# Patient Record
Sex: Female | Born: 2000 | Race: White | Hispanic: No | Marital: Single | Smoking: Never smoker
Health system: Southern US, Community
[De-identification: ages and names within clinical notes are randomized; demographics above are authoritative.]

## PROBLEM LIST (undated history)

## (undated) DIAGNOSIS — F909 Attention-deficit hyperactivity disorder, unspecified type: Secondary | ICD-10-CM

## (undated) HISTORY — PX: WISDOM TOOTH EXTRACTION: SHX21

---
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
## 2022-11-10 ENCOUNTER — Ambulatory Visit
Admission: RE | Admit: 2022-11-10 | Discharge: 2022-11-10 | Disposition: A | Discharge status: Home / Self Care | Payer: Managed Care, Other (non HMO) | Source: Ambulatory Visit

## 2022-11-10 ENCOUNTER — Ambulatory Visit: Payer: Managed Care, Other (non HMO) | Admitting: Medical

## 2022-11-10 VITALS — BP 97/60 | HR 94 | Temp 98.7°F | Resp 18

## 2022-11-10 DIAGNOSIS — J069 Acute upper respiratory infection, unspecified: Secondary | ICD-10-CM | POA: Diagnosis not present

## 2022-11-10 MED ORDER — AZELASTINE HCL 0.1 % NA SOLN: 1.0000 | Freq: Two times a day (BID) | NASAL | 0 refills | Status: DC

## 2022-11-10 NOTE — ED Triage Notes (Signed)
Patient to Urgent Care with complaints of right sided eye swelling/ watery eyes and nasal drainage. Reports a lot of sneezing. Dry cough.  Reports symptoms started two days ago.  Has been taking dayquil/ nyquil/ allergy meds/ advil.

## 2022-11-10 NOTE — ED Provider Notes (Signed)
Patricia Kelly    CSN: [id number] Arrival date & time: 11/10/22  1451      History   Chief Complaint Chief Complaint  Patient presents with   Migraine    Water eyes runny nose - Entered by patient    HPI Patricia Kelly is a 22 y.o. female.    Migraine   Patient presents to urgent care with complaint of symptoms starting 2 days ago.  She endorses right eye swelling and watery discharge as well as runny nasal drainage and congestion.  Reports sneezing and occasional dry cough.  She has been using OTC medications such as Zyrtec, DayQuil, NyQuil and reports her nasal congestion and runny nose has not been adequately treated.   Past Medical History:  Diagnosis Date   ADHD    Kidney infection 07/20/2020    There are no problems to display for this patient.   No past surgical history on file.  OB History   No obstetric history on file.      Home Medications    Prior to Admission medications   Medication Sig Start Date End Date Taking? Authorizing Provider  amoxicillin-clavulanate (AUGMENTIN) 875-125 MG tablet Take 1 tablet by mouth every 12 (twelve) hours. 08/17/20   Sharion Balloon, NP  amphetamine-dextroamphetamine (ADDERALL XR) 30 MG 24 hr capsule Take 30 mg by mouth daily.    [provider]  amphetamine-dextroamphetamine (ADDERALL) 5 MG tablet Take by mouth daily.    [provider]  drospirenone-ethinyl estradiol (YAZ) 3-0.02 MG tablet Take 1 tablet by mouth daily.    [provider]  spironolactone (ALDACTONE) 25 MG tablet Take 25 mg by mouth daily.    [provider]  spironolactone (ALDACTONE) 50 MG tablet Take by mouth daily.    [provider]    Family History No family history on file.  Social History Social History   Tobacco Use   Smoking status: Never   Smokeless tobacco: Never  Substance Use Topics   Alcohol use: Yes   Drug use: Never     Allergies   Patient has no known  allergies.   Review of Systems Review of Systems   Physical Exam Triage Vital Signs ED Triage Vitals  Enc Vitals Group     BP      Pulse      Resp      Temp      Temp src      SpO2      Weight      Height      Head Circumference      Peak Flow      Pain Score      Pain Loc      Pain Edu?      Excl. in [place]?    No data found.  Updated Vital Signs There were no vitals taken for this visit.  Visual Acuity Right Eye Distance:   Left Eye Distance:   Bilateral Distance:    Right Eye Near:   Left Eye Near:    Bilateral Near:     Physical Exam Vitals reviewed.  Constitutional:      Appearance: Normal appearance.  Eyes:     General:        Right eye: Discharge present.  Cardiovascular:     Rate and Rhythm: Normal rate and regular rhythm.     Pulses: Normal pulses.     Heart sounds: Normal heart sounds.  Pulmonary:     Effort: Pulmonary  effort is normal.     Breath sounds: Normal breath sounds.  Skin:    General: Skin is warm and dry.  Neurological:     General: No focal deficit present.     Mental Status: She is alert and oriented to person, place, and time.  Psychiatric:        Mood and Affect: Mood normal.        Behavior: Behavior normal.      UC Treatments / Results  Labs (all labs ordered are listed, but only abnormal results are displayed) Labs Reviewed - No data to display  EKG   Radiology No results found.  Procedures Procedures (including critical care time)  Medications Ordered in UC Medications - No data to display  Initial Impression / Assessment and Plan / UC Course  I have reviewed the triage vital signs and the nursing notes.  Pertinent labs & imaging results that were available during my care of the patient were reviewed by me and considered in my medical decision making (see chart for details).   Patient is afebrile here without recent antipyretics. Satting well on room air. Overall is ill appearing, well hydrated, without  respiratory distress. Pulmonary exam is unremarkable.  Lungs CTAB without wheezing, rhonchi, rales.  Right eye conjunctival injection with significant watery discharge is present.  Symptoms are consistent with an acute viral process.  Recommending continued use of OTC medication for symptom control and will provide patient with recommendation list.  Will also prescribe Astelin spray to help resolve rhinorrhea.  Final Clinical Impressions(s) / UC Diagnoses   Final diagnoses:  None   Discharge Instructions   None    ED Prescriptions   None    PDMP not reviewed this encounter.   Rose Phi, [place] 11/10/22 1550

## 2022-11-10 NOTE — Discharge Instructions (Addendum)
You have been diagnosed with a viral upper respiratory infection based on your symptoms and exam. Viral illnesses cannot be treated with antibiotics - they are self limiting - and you should find your symptoms resolving within a few days. Get plenty of rest and non-caffeinated fluids. Watch for signs of dehydration including reduced urine output and dark colored urine.  We recommend you use over-the-counter medications for symptom control including acetaminophen (Tylenol), ibuprofen (Advil/Motrin) or naproxen (Aleve) for throat pain, fever, chills or body aches. You may combine use of acetaminophen and ibuprofen/naproxen if needed.  Some patients find an pain-relieving throat spray such as Chloraseptic to be effective.     Also recommend cold/cough medication containing a cough suppressant such as dextromethorphan, as needed.  Saline mist spray is helpful for removing excess mucus from your nose.  Room humidifiers are helpful to ease breathing at night. I recommend guaifenesin (Mucinex) with plenty of water throughout the day to help thin and loosen mucus secretions in your respiratory passages.   If appropriate based upon your other medical problems, you might also find relief of nasal/sinus congestion symptoms by using a nasal decongestant such as fluticasone (Flonase ) or pseudoephedrine (Sudafed sinus).  You will need to obtain Sudafed from behind the pharmacist counter.  Speak to the pharmacist to verify that you are not duplicating medications with other over-the-counter formulations that you may be using.   

## 2023-02-07 ENCOUNTER — Ambulatory Visit (INDEPENDENT_AMBULATORY_CARE_PROVIDER_SITE_OTHER): Payer: Managed Care, Other (non HMO) | Admitting: Adult Health

## 2023-02-07 ENCOUNTER — Encounter: Payer: Self-pay | Admitting: Adult Health

## 2023-02-07 VITALS — HR 134 | Temp 98.7°F | Ht 63.0 in | Wt 132.0 lb

## 2023-02-07 DIAGNOSIS — R300 Dysuria: Secondary | ICD-10-CM

## 2023-02-07 DIAGNOSIS — R319 Hematuria, unspecified: Secondary | ICD-10-CM

## 2023-02-07 DIAGNOSIS — N390 Urinary tract infection, site not specified: Secondary | ICD-10-CM | POA: Diagnosis not present

## 2023-02-07 LAB — POCT URINALYSIS DIPSTICK (MANUAL)
Nitrite, UA: NEGATIVE
Poct Bilirubin: NEGATIVE
Poct Blood: 250 — AB
Poct Glucose: NORMAL mg/dL
Poct Ketones: NEGATIVE
Poct Protein: NEGATIVE mg/dL
Poct Urobilinogen: NORMAL mg/dL
Spec Grav, UA: 1.015 (ref 1.010–1.025)
pH, UA: 6.5 (ref 5.0–8.0)

## 2023-02-07 MED ORDER — NITROFURANTOIN MONOHYD MACRO 100 MG PO CAPS: 100.0000 mg | ORAL_CAPSULE | Freq: Two times a day (BID) | ORAL | 0 refills | Status: DC

## 2023-02-07 NOTE — Progress Notes (Signed)
[facility] [address] [place], [place] [address] Phone: [phone] Fax: [phone]   Office Visit Note  Patient Name: Patricia Kelly  Date of ONGEX:[id number]  Med Rec number [id number]  Date of Service: 02/07/2023  Patient has no known allergies.  Chief Complaint  Patient presents with   Urinary Tract Infection     Urinary Tract Infection  Associated symptoms include frequency and urgency. Pertinent negatives include no chills, flank pain or hematuria.    Patient reports 2 days ago she started having some pain with urination.  She started drinking cranberry juice which seemed to help.  Then it work her up overnight, and she has taken tylenol and motrin, and that seems to have helped some. She denies any flank or abdominal pain.  She is not currently sexually active.   Current Medication:  Outpatient Encounter Medications as of 02/07/2023  Medication Sig   amphetamine-dextroamphetamine (ADDERALL XR) 30 MG 24 hr capsule Take 30 mg by mouth daily.   amphetamine-dextroamphetamine (ADDERALL) 5 MG tablet Take by mouth daily.   spironolactone (ALDACTONE) 25 MG tablet Take 25 mg by mouth daily.   spironolactone (ALDACTONE) 50 MG tablet Take by mouth daily.   amoxicillin-clavulanate (AUGMENTIN) 875-125 MG tablet Take 1 tablet by mouth every 12 (twelve) hours. (Patient not taking: Reported on 02/07/2023)   azelastine (ASTELIN) 0.1 % nasal spray Place 1 spray into both nostrils 2 (two) times daily. Use in each nostril as directed (Patient not taking: Reported on 02/07/2023)   drospirenone-ethinyl estradiol (YAZ) 3-0.02 MG tablet Take 1 tablet by mouth daily.   No facility-administered encounter medications on file as of 02/07/2023.      Medical History: Past Medical History:  Diagnosis Date   ADHD    Kidney infection 07/20/2020     Vital Signs: Pulse (!) 134   Temp 98.7 F (37.1 C) (Tympanic)   Ht 5\' 3"  (1.6 m)   Wt 132 lb (59.9 kg)   SpO2 98%   BMI 23.38 kg/m     Review of Systems  Constitutional:  Negative for chills, fatigue and fever.  Eyes:  Negative for pain, redness and itching.  Gastrointestinal:  Negative for abdominal pain.  Genitourinary:  Positive for frequency and urgency. Negative for difficulty urinating, flank pain and hematuria.    Physical Exam Vitals and nursing note reviewed.  Constitutional:      Appearance: Normal appearance.  HENT:     Head: Normocephalic.  Abdominal:     Tenderness: There is no abdominal tenderness. There is no right CVA tenderness or left CVA tenderness.  Neurological:     Mental Status: She is alert.      Results for orders placed or performed in visit on 02/07/23 (from the past 24 hour(s))  POCT Urinalysis Dip Manual     Status: Abnormal   Collection Time: 02/07/23  4:21 PM  Result Value Ref Range   Spec Grav, UA 1.015 1.010 - 1.025   pH, UA 6.5 5.0 - 8.0   Leukocytes, UA Trace (A) Negative   Nitrite, UA Negative Negative   Poct Protein Negative Negative, trace mg/dL   Poct Glucose Normal Normal mg/dL   Poct Ketones Negative Negative   Poct Urobilinogen Normal Normal mg/dL   Poct Bilirubin Negative Negative   Poct Blood =250 (A) Negative, trace    Assessment/Plan: 1. Urinary tract infection with hematuria, site unspecified Take Nitrofurantoin every 12 hours (twice a day) with food x 7d; Finish all antibiotics. Drink plenty of water.  Avoid or limit alcohol and caffeine, which may make symptoms worse. You may take an over-the-counter pain reliever (i.e., AZO urinary pain relief, Tylenol) as needed for pain next 1-2 days. Send secure message to provider or schedule return appointment as needed for new/worsening symptoms (such as fever or abdominal pain) if your symptoms are not improving after 2-3 days taking antibiotics or if your symptoms do not completely resolve following antibiotics.  - nitrofurantoin, macrocrystal-monohydrate, (MACROBID) 100 MG capsule; Take 1 capsule (100 mg total)  by mouth 2 (two) times daily.  Dispense: 14 capsule; Refill: 0  2. Dysuria - POCT Urinalysis Dip Manual             General Counseling: Leida verbalizes understanding of the findings of todays visit and agrees with plan of treatment. I have discussed any further diagnostic evaluation that may be needed or ordered today. We also reviewed her medications today. she has been encouraged to call the office with any questions or concerns that should arise related to todays visit.   Orders Placed This Encounter  Procedures   POCT Urinalysis Dip Manual    No orders of the defined types were placed in this encounter.   Time spent:15 Minutes Time spent includes review of chart, medications, test results, and follow up plan with the patient.    Johnna Acosta AGNP-C Nurse Practitioner

## 2023-06-13 ENCOUNTER — Ambulatory Visit (INDEPENDENT_AMBULATORY_CARE_PROVIDER_SITE_OTHER): Payer: Managed Care, Other (non HMO) | Admitting: Adult Health

## 2023-06-13 ENCOUNTER — Encounter: Payer: Self-pay | Admitting: Adult Health

## 2023-06-13 VITALS — BP 106/70 | HR 103 | Temp 99.6°F | Ht 62.5 in | Wt 136.0 lb

## 2023-06-13 DIAGNOSIS — U071 COVID-19: Secondary | ICD-10-CM

## 2023-06-13 DIAGNOSIS — J029 Acute pharyngitis, unspecified: Secondary | ICD-10-CM

## 2023-06-13 LAB — POC SOFIA 2 FLU + SARS ANTIGEN FIA
Influenza A, POC: NEGATIVE
Influenza B, POC: NEGATIVE
SARS Coronavirus 2 Ag: POSITIVE — AB

## 2023-06-13 NOTE — Progress Notes (Signed)
[facility] [address] [place], [place] [address] Phone: [phone] Fax: [phone]   Office Visit Note  Patient Name: Patricia Kelly  Date of ZHYQM:[id number]  Med Rec number [id number]  Date of Service: 06/13/2023  Patient has no known allergies.  Chief Complaint  Patient presents with   Acute Visit     HPI Patient reports started feeling bad yesterday morning.  She has a few close contacts who were briefly sick.  She describes sore throat, headache, ear pressure and body aches.  She took tylenol Pm last night, and slept well.    Current Medication:  Outpatient Encounter Medications as of 06/13/2023  Medication Sig   amphetamine-dextroamphetamine (ADDERALL XR) 30 MG 24 hr capsule Take 30 mg by mouth daily.   drospirenone-ethinyl estradiol (YAZ) 3-0.02 MG tablet Take 1 tablet by mouth daily.   amoxicillin-clavulanate (AUGMENTIN) 875-125 MG tablet Take 1 tablet by mouth every 12 (twelve) hours. (Patient not taking: Reported on 02/07/2023)   amphetamine-dextroamphetamine (ADDERALL) 5 MG tablet Take by mouth daily. (Patient not taking: Reported on 06/13/2023)   azelastine (ASTELIN) 0.1 % nasal spray Place 1 spray into both nostrils 2 (two) times daily. Use in each nostril as directed (Patient not taking: Reported on 02/07/2023)   nitrofurantoin, macrocrystal-monohydrate, (MACROBID) 100 MG capsule Take 1 capsule (100 mg total) by mouth 2 (two) times daily. (Patient not taking: Reported on 06/13/2023)   spironolactone (ALDACTONE) 25 MG tablet Take 25 mg by mouth daily. (Patient not taking: Reported on 06/13/2023)   spironolactone (ALDACTONE) 50 MG tablet Take by mouth daily. (Patient not taking: Reported on 06/13/2023)   No facility-administered encounter medications on file as of 06/13/2023.      Medical History: Past Medical History:  Diagnosis Date   ADHD    Kidney infection 07/20/2020     Vital Signs: BP 106/70   Pulse (!) 103   Temp 99.6 F (37.6 C)  (Tympanic)   Ht 5' 2.5" (1.588 m)   Wt 136 lb (61.7 kg)   SpO2 99%   BMI 24.48 kg/m    Review of Systems  Constitutional:  Positive for fatigue. Negative for chills and fever.  HENT:  Positive for ear pain and sore throat.   Eyes:  Negative for pain and itching.  Respiratory:  Negative for cough.   Cardiovascular:  Negative for chest pain.  Gastrointestinal:  Negative for diarrhea, nausea and vomiting.  Musculoskeletal:  Positive for myalgias.  Neurological:  Positive for headaches.    Physical Exam Vitals reviewed.  Constitutional:      Appearance: Normal appearance.  HENT:     Head: Normocephalic.     Right Ear: Tympanic membrane and ear canal normal.     Left Ear: Tympanic membrane and ear canal normal.     Nose: Nose normal.     Mouth/Throat:     Mouth: Mucous membranes are moist.  Eyes:     Pupils: Pupils are equal, round, and reactive to light.  Pulmonary:     Effort: Pulmonary effort is normal.     Breath sounds: Normal breath sounds.  Lymphadenopathy:     Cervical: No cervical adenopathy.  Neurological:     Mental Status: She is alert.      Results for orders placed or performed in visit on 06/13/23 (from the past 24 hour(s))  POC SOFIA 2 FLU + SARS ANTIGEN FIA     Status: Abnormal   Collection Time: 06/13/23  9:57 AM  Result Value Ref Range  Influenza A, POC Negative Negative   Influenza B, POC Negative Negative   SARS Coronavirus 2 Ag Positive (A) Negative    Assessment/Plan: 1. COVID-19 You should wear a well-fitting mask (ideally a N95 or KN95) anytime you must be outside your room for the initial 5 days of symptoms. After the initial 5 days, you may resume activities as you feel appropriate as long as you continue to wear a mask.  Rest, and drink plenty of water.  Use cough drops, gargle warm sal water or drink wam liquids (like tea with honey) as needed for cough/throat irritation.   Take over-the-counter medicines (such as Dayquil or Nyquil) as  discussed at your visit to help manage your symptoms.  Send a MyChart message to the provider or schedule a return appointment as needed for new/worsening symptoms (especially shortness of breath or chest pain) or if symptoms not improving with recommended treatment over the next 5-7 days.      2. Sore throat - POC SOFIA 2 FLU + SARS ANTIGEN FIA     General Counseling: Patricia Kelly verbalizes understanding of the findings of todays visit and agrees with plan of treatment. I have discussed any further diagnostic evaluation that may be needed or ordered today. We also reviewed her medications today. she has been encouraged to call the office with any questions or concerns that should arise related to todays visit.   Orders Placed This Encounter  Procedures   POC SOFIA 2 FLU + SARS ANTIGEN FIA    No orders of the defined types were placed in this encounter.   Time spent:15 Minutes Time spent includes review of chart, medications, test results, and follow up plan with the patient.    Patricia Kelly AGNP-C Nurse Practitioner

## 2023-06-14 ENCOUNTER — Encounter: Payer: Self-pay | Admitting: Adult Health

## 2023-07-06 ENCOUNTER — Encounter: Payer: Self-pay | Admitting: Physician Assistant

## 2023-07-06 ENCOUNTER — Ambulatory Visit: Payer: Managed Care, Other (non HMO) | Admitting: Physician Assistant

## 2023-07-06 VITALS — HR 81 | Temp 99.8°F

## 2023-07-06 DIAGNOSIS — R300 Dysuria: Secondary | ICD-10-CM | POA: Diagnosis not present

## 2023-07-06 DIAGNOSIS — N390 Urinary tract infection, site not specified: Secondary | ICD-10-CM | POA: Diagnosis not present

## 2023-07-06 DIAGNOSIS — R319 Hematuria, unspecified: Secondary | ICD-10-CM

## 2023-07-06 LAB — POCT URINALYSIS DIPSTICK (MANUAL)
Nitrite, UA: NEGATIVE
Poct Bilirubin: NEGATIVE
Poct Blood: 250 — AB
Poct Glucose: NORMAL mg/dL
Poct Ketones: NEGATIVE
Poct Protein: NEGATIVE mg/dL
Poct Urobilinogen: NORMAL mg/dL
Spec Grav, UA: <=1.005 — AB (ref 1.010–1.025)
pH, UA: 6 (ref 5.0–8.0)

## 2023-07-06 MED ORDER — CEPHALEXIN 500 MG PO CAPS: 500.0000 mg | ORAL_CAPSULE | Freq: Two times a day (BID) | ORAL | 0 refills | Status: AC

## 2023-07-06 NOTE — Progress Notes (Signed)
[facility] [address] [place], [place] [address] Phone: [phone] Fax: [phone]   Office Visit Note  Patient Name: Patricia Kelly  Date of YQIHK:[id number]  Med Rec number [id number]   Patient has no known allergies.  Chief Complaint  Patient presents with   Urinary Tract Infection   22 year old female with concern for UTI  Dysuria since yesterday  No real frequency/urgency changes, urinating both large and small amounts No odor, no unusual color  No abdominal cramping  No fevers, chills, back pain   Drinking a lot of cranberry juice Staying well hydrated with water  Wipes front to back, urinates after sex Currently on menstrual cycle with some RLQ tenderness corresponding to this  Current Medication:  Outpatient Encounter Medications as of 07/06/2023  Medication Sig   amphetamine-dextroamphetamine (ADDERALL XR) 30 MG 24 hr capsule Take 30 mg by mouth daily.   cephALEXin (KEFLEX) 500 MG capsule Take 1 capsule (500 mg total) by mouth 2 (two) times daily for 7 days.   drospirenone-ethinyl estradiol (YAZ) 3-0.02 MG tablet Take 1 tablet by mouth daily.   amoxicillin-clavulanate (AUGMENTIN) 875-125 MG tablet Take 1 tablet by mouth every 12 (twelve) hours. (Patient not taking: Reported on 02/07/2023)   amphetamine-dextroamphetamine (ADDERALL) 5 MG tablet Take by mouth daily. (Patient not taking: Reported on 06/13/2023)   azelastine (ASTELIN) 0.1 % nasal spray Place 1 spray into both nostrils 2 (two) times daily. Use in each nostril as directed (Patient not taking: Reported on 02/07/2023)   nitrofurantoin, macrocrystal-monohydrate, (MACROBID) 100 MG capsule Take 1 capsule (100 mg total) by mouth 2 (two) times daily. (Patient not taking: Reported on 06/13/2023)   spironolactone (ALDACTONE) 25 MG tablet Take 25 mg by mouth daily. (Patient not taking: Reported on 06/13/2023)   spironolactone (ALDACTONE) 50 MG tablet Take by mouth daily. (Patient not taking: Reported on  06/13/2023)   No facility-administered encounter medications on file as of 07/06/2023.      Medical History: Past Medical History:  Diagnosis Date   ADHD    Kidney infection 07/20/2020     Vital Signs: Pulse 81   Temp 99.8 F (37.7 C) (Tympanic)   SpO2 100%    ROS negative unless otherwise indicated above.  Physical Exam Vitals reviewed.  Constitutional:      Appearance: Normal appearance.  Abdominal:     General: There is no distension.     Palpations: Abdomen is soft. There is no shifting dullness, hepatomegaly, splenomegaly, mass or pulsatile mass.     Tenderness: There is abdominal tenderness in the right lower quadrant. There is no right CVA tenderness, left CVA tenderness, guarding or rebound.     Hernia: No hernia is present.     Comments: RLQ tenderness but states this is due to menstrual cycle  Neurological:     Mental Status: She is alert.     Results for orders placed or performed in visit on 07/06/23 (from the past 24 hour(s))  POCT Urinalysis Dip Manual     Status: Abnormal   Collection Time: 07/06/23  3:58 PM  Result Value Ref Range   Spec Grav, UA <=1.005 (A) 1.010 - 1.025   pH, UA 6.0 5.0 - 8.0   Leukocytes, UA Moderate (2+) (A) Negative   Nitrite, UA Negative Negative   Poct Protein Negative Negative, trace mg/dL   Poct Glucose Normal Normal mg/dL   Poct Ketones Negative Negative   Poct Urobilinogen Normal Normal mg/dL   Poct Bilirubin Negative Negative  Poct Blood =250 (A) Negative, trace     Assessment/Plan:  Urinary tract infection without hematuria, site unspecified - POCT Urinalysis Dip Manual - cephALEXin (KEFLEX) 500 MG capsule; Take 1 capsule (500 mg total) by mouth 2 (two) times daily for 7 days.  Dispense: 14 capsule; Refill: 0 - Urine Culture   Will send out for culture to make sure susceptible to abx prescribed as as above and given PMH includes kidney infection. Blood noted with consideration of current menstrual  cycle.    General Counseling: Patricia Kelly understanding of the findings of todays visit and agrees with plan of treatment. I have discussed any further diagnostic evaluation that may be needed or ordered today. We also reviewed her medications today. she has been encouraged to call the office with any questions or concerns that should arise related to todays visit.   Orders Placed This Encounter  Procedures   Urine Culture   POCT Urinalysis Dip Manual    Meds ordered this encounter  Medications   cephALEXin (KEFLEX) 500 MG capsule    Sig: Take 1 capsule (500 mg total) by mouth 2 (two) times daily for 7 days.    Dispense:  14 capsule    Refill:  0    Order Specific Question:   Supervising Provider    Answer:   Erasmo Downer [[id number]]      SignedLennon Alstrom, PA-C 07/06/2023, 4:34 PM

## 2023-07-08 LAB — URINE CULTURE

## 2023-07-22 ENCOUNTER — Ambulatory Visit (INDEPENDENT_AMBULATORY_CARE_PROVIDER_SITE_OTHER): Payer: Managed Care, Other (non HMO) | Admitting: Physician Assistant

## 2023-07-22 ENCOUNTER — Encounter: Payer: Self-pay | Admitting: Physician Assistant

## 2023-07-22 VITALS — BP 118/78 | HR 97 | Temp 97.5°F | Ht 63.0 in | Wt 137.0 lb

## 2023-07-22 DIAGNOSIS — R300 Dysuria: Secondary | ICD-10-CM | POA: Diagnosis not present

## 2023-07-22 DIAGNOSIS — N390 Urinary tract infection, site not specified: Secondary | ICD-10-CM

## 2023-07-22 DIAGNOSIS — R319 Hematuria, unspecified: Secondary | ICD-10-CM | POA: Diagnosis not present

## 2023-07-22 LAB — POCT URINALYSIS DIPSTICK
Bilirubin, UA: NEGATIVE
Glucose, UA: NEGATIVE
Ketones, UA: NEGATIVE
Nitrite, UA: NEGATIVE
Protein, UA: POSITIVE — AB
Spec Grav, UA: 1.01 (ref 1.010–1.025)
Urobilinogen, UA: NEGATIVE U/dL — AB
pH, UA: 6.5 (ref 5.0–8.0)

## 2023-07-22 MED ORDER — SULFAMETHOXAZOLE-TRIMETHOPRIM 800-160 MG PO TABS: 1.0000 | ORAL_TABLET | Freq: Two times a day (BID) | ORAL | 0 refills | Status: AC

## 2023-07-22 NOTE — Progress Notes (Signed)
[facility] [address] [place], [place] [address] Phone: [phone] Fax: [phone]   Office Visit Note  Patient Name: Patricia Kelly  Date of WFUXN:[id number]  Med Rec number [id number]   Patient has no known allergies.  Chief Complaint  Patient presents with   Urinary Retention    Feels the urge to urinate but can't. has taken cephalexin, Dnannose+cranberry, tylenol and motrin. She is also bleeding premature to her cycle   22 year old female presents for ongoing sx of UTI,  See visit from 07/06/23.  At that time, she was prescribed Keflex for symptoms of a UTI with UA dipstick showing leukocytes.  Subsequent culture, however, did not show any bacteria but rather mixed flora.   At some point, she discontinued the Keflex.    She then restarted the Keflex today, as she had symptoms of a UTI.   Symptoms include urgency, frequency, dysuria.   No feelings of fever, chills, abdominal cramping, back pain   She has mentioned that she is having breakthrough bleeding in her cycle.  We did discuss that this could be due to taking an antibiotic on birth control, which makes her birth control less effective.  Pregnancy test offered, declined.  She is sexually active, uses condoms every time, and is sexually active with men.  Current Medication:  Outpatient Encounter Medications as of 07/22/2023  Medication Sig   amphetamine-dextroamphetamine (ADDERALL XR) 30 MG 24 hr capsule Take 30 mg by mouth daily.   drospirenone-ethinyl estradiol (YAZ) 3-0.02 MG tablet Take 1 tablet by mouth daily.   sulfamethoxazole-trimethoprim (BACTRIM DS) 800-160 MG tablet Take 1 tablet by mouth 2 (two) times daily for 10 days.   amphetamine-dextroamphetamine (ADDERALL) 5 MG tablet Take by mouth daily. (Patient not taking: Reported on 06/13/2023)   azelastine (ASTELIN) 0.1 % nasal spray Place 1 spray into both nostrils 2 (two) times daily. Use in each nostril as directed (Patient not taking:  Reported on 02/07/2023)   spironolactone (ALDACTONE) 25 MG tablet Take 25 mg by mouth daily. (Patient not taking: Reported on 06/13/2023)   spironolactone (ALDACTONE) 50 MG tablet Take by mouth daily. (Patient not taking: Reported on 06/13/2023)   [DISCONTINUED] amoxicillin-clavulanate (AUGMENTIN) 875-125 MG tablet Take 1 tablet by mouth every 12 (twelve) hours. (Patient not taking: Reported on 02/07/2023)   [DISCONTINUED] nitrofurantoin, macrocrystal-monohydrate, (MACROBID) 100 MG capsule Take 1 capsule (100 mg total) by mouth 2 (two) times daily. (Patient not taking: Reported on 06/13/2023)   No facility-administered encounter medications on file as of 07/22/2023.      Medical History: Past Medical History:  Diagnosis Date   ADHD    Kidney infection 07/20/2020     Vital Signs: BP 118/78   Pulse 97   Temp (!) 97.5 F (36.4 C)   Ht 5\' 3"  (1.6 m)   Wt 137 lb (62.1 kg)   SpO2 99%   BMI 24.27 kg/m    ROS negative unless otherwise indicated above.  Physical Exam Vitals reviewed.  Abdominal:     General: Bowel sounds are normal.     Tenderness: There is no abdominal tenderness. There is no right CVA tenderness, left CVA tenderness, guarding or rebound.     Hernia: No hernia is present.  Neurological:     Mental Status: She is alert.     Results for orders placed or performed in visit on 07/22/23 (from the past 24 hour(s))  POCT urinalysis dipstick     Status: Abnormal   Collection Time: 07/22/23 11:43  AM  Result Value Ref Range   Color, UA yellow    Clarity, UA cloudy    Glucose, UA Negative Negative   Bilirubin, UA negative/normal    Ketones, UA negative/normal    Spec Grav, UA 1.010 1.010 - 1.025   Blood, UA trace Large    pH, UA 6.5 5.0 - 8.0   Protein, UA Positive (A) Negative   Urobilinogen, UA negative (A) 0.2 or 1.0 E.U./dL   Nitrite, UA negative/normal    Leukocytes, UA Large (3+) (A) Negative   Appearance     Odor       Assessment/Plan:  1. Dysuria -  POCT urinalysis dipstick  2. Urinary tract infection with hematuria, site unspecified - sulfamethoxazole-trimethoprim (BACTRIM DS) 800-160 MG tablet; Take 1 tablet by mouth 2 (two) times daily for 10 days.  Dispense: 20 tablet; Refill: 0 - Urine Culture  Urine dipstick again shows leukocytes, large.  She also has hematuria with patient reporting breakthrough menstrual cycle bleeding. No CVA tenderness. Abdominal exam unrevealing.  Her culture 2 weeks ago was negative for significant bacteria.    Given her ongoing leukocytes on dipstick today with ongoing sx, we will send her urine out again for culture.  However, since she has been on abx already, her culture will likely be unrevealing at this time.    I have changed her abx to Bactrim. If this does not result in resolution of her sx, STI testing should be performed. On review of past notes, STI testing declined in the past but should be considered.   We also discussed a pregnancy test, given her break through bleeding. Pregnancy test declined by patient. Her breakthrough bleeding could be the result of taking abx on OCPs; however, I did state that she might want to consider a pregnancy test. Pregnancy test discussed, offered, declined by pt.  Additional information: Finish all antibiotics. Drink plenty of water. Avoid or limit alcohol and caffeine, which may make symptoms worse. You may take an over-the-counter pain reliever (i.e., AZO urinary pain relief, Tylenol) as needed for pain next 1-2 days. Send secure message to provider or schedule return appointment as needed for new/worsening symptoms (such as fever or abdominal pain) if your symptoms are not improving after 2-3 days taking antibiotics or if your symptoms do not completely resolve following antibiotics.    General Counseling: katricia sadlon understanding of the findings of todays visit and agrees with plan of treatment. I have discussed any further diagnostic evaluation that may  be needed or ordered today. We also reviewed her medications today. she has been encouraged to call the office with any questions or concerns that should arise related to todays visit.   Orders Placed This Encounter  Procedures   Urine Culture   POCT urinalysis dipstick    Meds ordered this encounter  Medications   sulfamethoxazole-trimethoprim (BACTRIM DS) 800-160 MG tablet    Sig: Take 1 tablet by mouth 2 (two) times daily for 10 days.    Dispense:  20 tablet    Refill:  0    Order Specific Question:   Supervising Provider    Answer:   Erasmo Downer [[id number]]      SignedLennon Alstrom, PA-C 07/22/2023, 2:31 PM

## 2023-07-24 LAB — URINE CULTURE: Organism ID, Bacteria: NO GROWTH

## 2023-11-21 ENCOUNTER — Encounter: Payer: Self-pay | Admitting: Medical

## 2023-11-21 ENCOUNTER — Other Ambulatory Visit: Payer: Self-pay

## 2023-11-21 ENCOUNTER — Ambulatory Visit (INDEPENDENT_AMBULATORY_CARE_PROVIDER_SITE_OTHER): Payer: Managed Care, Other (non HMO) | Admitting: Medical

## 2023-11-21 VITALS — BP 108/73 | HR 123 | Temp 98.8°F | Wt 141.0 lb

## 2023-11-21 DIAGNOSIS — J069 Acute upper respiratory infection, unspecified: Secondary | ICD-10-CM | POA: Diagnosis not present

## 2023-11-21 DIAGNOSIS — H1031 Unspecified acute conjunctivitis, right eye: Secondary | ICD-10-CM

## 2023-11-21 MED ORDER — GENTAMICIN SULFATE 0.3 % OP SOLN: OPHTHALMIC | 0 refills | Status: AC

## 2023-11-21 NOTE — Progress Notes (Signed)
 [facility] [address] [place], [place] [address] Phone: [phone] Fax: [phone]   Office Visit Note  Patient Name: Patricia Kelly  Date of [id number]  Med Rec number [id number]  Date of Service: 11/21/2023  Allergies: Patient has no known allergies.  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  Late last night, noted some congestion. Woke up this AM with increased congestion to nose, some pressure under eyes. Right eye also looked some swollen (eyelids) and has been watery. No crust to eye. Mild fatigue.  No sore throat or consistent cough. Has not suspected fever, no chills. Mild frontal HA in last hour. No nausea, vomiting, diarrhea.  Denies known exposures. No recent sickness. Does not wear contacts.  Took a Sudafed product and Emergen-C this AM. Also used saline nasal spray. Meds some helpful.  Flying tomorrow to [place]. Perhaps occasional sinus infection in past. States she has had tendency to develop pink eye in similar situations in past.   Current Medication:  Outpatient Encounter Medications as of 11/21/2023  Medication Sig   Amphet-Dextroamphet 3-Bead ER 37.5 MG CP24 Take 1 capsule by mouth daily.   amphetamine-dextroamphetamine (ADDERALL) 10 MG tablet Take 10 mg by mouth daily.   doxycycline (VIBRAMYCIN) 100 MG capsule SMARTSIG:1 pill By Mouth Twice Daily   drospirenone-ethinyl estradiol (YAZ) 3-0.02 MG tablet Take 1 tablet by mouth daily.   [DISCONTINUED] amphetamine-dextroamphetamine (ADDERALL XR) 30 MG 24 hr capsule Take 30 mg by mouth daily.   [DISCONTINUED] amphetamine-dextroamphetamine (ADDERALL) 5 MG tablet Take by mouth daily. (Patient not taking: Reported on 06/13/2023)   [DISCONTINUED] azelastine (ASTELIN) 0.1 % nasal spray Place 1 spray into both nostrils 2 (two) times daily. Use in each nostril as directed (Patient not taking:  Reported on 11/21/2023)   [DISCONTINUED] spironolactone (ALDACTONE) 25 MG tablet Take 25 mg by mouth daily. (Patient not taking: Reported on 11/21/2023)   [DISCONTINUED] spironolactone (ALDACTONE) 50 MG tablet Take by mouth daily. (Patient not taking: Reported on 11/21/2023)   No facility-administered encounter medications on file as of 11/21/2023.      Medical History: Past Medical History:  Diagnosis Date   ADHD    Kidney infection 07/20/2020     Vital Signs: BP 108/73   Pulse (!) 123   Temp 98.8 F (37.1 C) (Tympanic)   Wt 141 lb (64 kg)   SpO2 100%   BMI 24.98 kg/m    Review of Systems See HPI  Physical Exam Vitals reviewed.  Constitutional:      General: She is not in acute distress.    Comments: Tired appearing  HENT:     Head: Normocephalic.     Right Ear: Ear canal and external ear normal. A PE tube is present.     Left Ear: Ear canal and external ear normal. A PE tube is present.     Ears:     Comments: TMs slightly dull bilaterally    Nose: Mucosal edema (R>L), congestion (R>L) and rhinorrhea (cloudy on right) present.     Right Sinus: No maxillary sinus tenderness or frontal sinus tenderness.     Left Sinus: No maxillary sinus tenderness or frontal sinus tenderness.     Mouth/Throat:     Mouth: Mucous membranes are moist. No oral lesions.     Pharynx: No pharyngeal swelling or posterior oropharyngeal erythema.     Tonsils: No tonsillar exudate. 0 on the  right. 0 on the left.  Eyes:     General: Lids are normal.     Comments: Slight erythema to conjunctiva on right. Sclerae clear. Right eye slightly watery.  Cardiovascular:     Rate and Rhythm: Normal rate and regular rhythm.     Heart sounds: No murmur heard.    No friction rub. No gallop.  Pulmonary:     Effort: Pulmonary effort is normal.     Breath sounds: Normal breath sounds. No wheezing, rhonchi or rales.  Musculoskeletal:     Cervical back: Neck supple. No rigidity.  Lymphadenopathy:      Cervical: Cervical adenopathy (small anterior cervical nodes, R>L) present.  Neurological:     Mental Status: She is alert.       Assessment/Plan: 1. Acute upper respiratory infection (Primary) 2. Acute conjunctivitis of right eye, unspecified acute conjunctivitis type - gentamicin (GARAMYCIN) 0.3 % ophthalmic solution; Instill one or two drops to affected eye every 4 hours while awake until symptoms resolve.  Dispense: 5 mL; Refill: 0   Suspect early viral URI. Advised to continue Sudafed. Discussed Afrin nasal spray before flight. Continue saline nasal spray.  May use artificial tears. Gave abx drops to hold given travel this weekend, do not use unless sx worsen.  Patient Instructions  -Rest and stay well hydrated (by drinking water and other liquids). Avoid/limit caffeine. -Take over-the-counter medicines (i.e. Sudafed, Ibuprofen) to help relieve your symptoms. -Continue saline nasal spray. Try Afrin nasal spray before your flight.  -Use artificial tears as needed for eye redness or irritation. -You may fill the prescription for antibiotic eye drops. Do not use them unless your symptoms get worse as we talked about. -Apply cool compress (cold washcloth) over eyes for 10-20 minutes as needed for pain/swelling/discomfort. -Avoid touching your eyes. When you must touch them, wash your hands or use hand sanitizer afterwards. -Do not wear eye makeup until your symptoms fully resolve. Consider discarding mascara or eye makeup worn recently.  -Send MyChart message to provider or schedule return visit as needed for new/worsening symptoms or if symptoms do not improve as discussed with recommended/prescribed treatment.    General Counseling: Patricia Kelly understanding of the findings of todays visit and plan of treatment. she has been encouraged to call the office with any questions or concerns that should arise related to todays visit.   Jonathon Resides PA-C [employer] 11/21/2023 2:33 PM

## 2023-11-28 ENCOUNTER — Emergency Department
Admission: EM | Admit: 2023-11-28 | Discharge: 2023-11-28 | Disposition: A | Discharge status: Home / Self Care | Payer: Managed Care, Other (non HMO) | Attending: Emergency Medicine | Admitting: Emergency Medicine

## 2023-11-28 ENCOUNTER — Other Ambulatory Visit: Payer: Self-pay

## 2023-11-28 DIAGNOSIS — R319 Hematuria, unspecified: Secondary | ICD-10-CM | POA: Insufficient documentation

## 2023-11-28 DIAGNOSIS — N390 Urinary tract infection, site not specified: Secondary | ICD-10-CM | POA: Insufficient documentation

## 2023-11-28 DIAGNOSIS — J069 Acute upper respiratory infection, unspecified: Secondary | ICD-10-CM | POA: Diagnosis not present

## 2023-11-28 DIAGNOSIS — J029 Acute pharyngitis, unspecified: Secondary | ICD-10-CM | POA: Diagnosis present

## 2023-11-28 LAB — URINALYSIS, ROUTINE W REFLEX MICROSCOPIC
Bilirubin Urine: NEGATIVE
Glucose, UA: NEGATIVE mg/dL
Ketones, ur: 20 mg/dL — AB
Nitrite: NEGATIVE
Protein, ur: 30 mg/dL — AB
Specific Gravity, Urine: 1.026 (ref 1.005–1.030)
pH: 5 (ref 5.0–8.0)

## 2023-11-28 LAB — RESP PANEL BY RT-PCR (RSV, FLU A&B, COVID)  RVPGX2
Influenza A by PCR: NEGATIVE
Influenza B by PCR: NEGATIVE
Resp Syncytial Virus by PCR: NEGATIVE
SARS Coronavirus 2 by RT PCR: NEGATIVE

## 2023-11-28 LAB — GROUP A STREP BY PCR: Group A Strep by PCR: NOT DETECTED

## 2023-11-28 MED ORDER — ACETAMINOPHEN 325 MG PO TABS
650.0000 mg | ORAL_TABLET | Freq: Once | ORAL | Status: AC
  Administered 2023-11-28: 650 mg via ORAL
  Filled 2023-11-28: qty 2

## 2023-11-28 MED ORDER — CEPHALEXIN 500 MG PO CAPS: 500.0000 mg | ORAL_CAPSULE | Freq: Two times a day (BID) | ORAL | 0 refills | Status: AC

## 2023-11-28 MED ORDER — IBUPROFEN 600 MG PO TABS
600.0000 mg | ORAL_TABLET | Freq: Once | ORAL | Status: AC
  Administered 2023-11-28: 600 mg via ORAL
  Filled 2023-11-28: qty 1

## 2023-11-28 NOTE — ED Provider Notes (Signed)
 [facility] Provider Note    Event Date/Time   First MD Initiated Contact with Patient 11/28/23 [phone]     (approximate)   History   Sore Throat   HPI  Patricia Kelly is a 23 y.o. female with PMH of ADHD presents for evaluation of URI symptoms.  Patient endorses sore throat, body aches, ear pain and cough.      Physical Exam   Triage Vital Signs: ED Triage Vitals  Encounter Vitals Group     BP 11/28/23 0807 116/89     Systolic BP Percentile --      Diastolic BP Percentile --      Pulse Rate 11/28/23 0807 100     Resp 11/28/23 0807 16     Temp 11/28/23 0807 98.4 F (36.9 C)     Temp Source 11/28/23 0807 Oral     SpO2 11/28/23 0807 100 %     Weight 11/28/23 0808 130 lb (59 kg)     Height 11/28/23 0808 5\' 2"  (1.575 m)     Head Circumference --      Peak Flow --      Pain Score 11/28/23 0808 7     Pain Loc --      Pain Education --      Exclude from Growth Chart --     Most recent vital signs: Vitals:   11/28/23 0824 11/28/23 1039  BP:    Pulse: 96   Resp: 18   Temp: (!) 100.6 F (38.1 C) 98.9 F (37.2 C)  SpO2: 100%    General: Awake, tearful on exam. CV:  Good peripheral perfusion.  RRR. Resp:  Normal effort.  CTAB. Abd:  No distention.  Other:  Oral mucous membranes are moist, no pharyngeal erythema, no tonsillar enlargement or exudates.  Bilateral TMs are translucent with ear tubes in place.   ED Results / Procedures / Treatments   Labs (all labs ordered are listed, but only abnormal results are displayed) Labs Reviewed  URINALYSIS, ROUTINE W REFLEX MICROSCOPIC - Abnormal; Notable for the following components:      Result Value   Color, Urine YELLOW (*)    APPearance CLOUDY (*)    Hgb urine dipstick LARGE (*)    Ketones, ur 20 (*)    Protein, ur 30 (*)    Leukocytes,Ua TRACE (*)    Bacteria, UA MANY (*)    All other components within normal limits  GROUP A STREP BY PCR  RESP PANEL BY RT-PCR (RSV, FLU A&B, COVID)   [id number]     PROCEDURES:  Critical Care performed: No  Procedures   MEDICATIONS ORDERED IN ED: Medications  ibuprofen (ADVIL) tablet 600 mg (has no administration in time range)  acetaminophen (TYLENOL) tablet 650 mg (650 mg Oral Given 11/28/23 0918)     IMPRESSION / MDM / ASSESSMENT AND PLAN / ED COURSE  I reviewed the triage vital signs and the nursing notes.                             23 year old female presents for evaluation of URI symptoms.  Patient had low-grade fever in triage otherwise vital signs are stable.  Patient uncomfortable on exam and is tearful.  Differential diagnosis includes, but is not limited to, flu, COVID, RSV, strep, viral pharyngitis, pneumonia, bronchitis, mono.  Patient's presentation is most consistent with acute complicated illness / injury requiring diagnostic workup.  Respiratory panel and  strep test both negative.  Patient requesting urinalysis as she has history of a kidney infection.  She denies back pain and urinary symptoms but does have a fever.  My suspicion for this is low.  Urinalysis notable for hemoglobin, ketones, protein, trace leukocytes with 6-10 WBCs and many bacteria.  Will treat for UTI with oral antibiotics.  She was advised on symptomatic management using Tylenol, ibuprofen and over-the-counter cold medicines.  Encourage lots of rest and hydration.  She was given a note for school.  She voiced understanding, all questions were answered and she is stable at discharge.   FINAL CLINICAL IMPRESSION(S) / ED DIAGNOSES   Final diagnoses:  Urinary tract infection with hematuria, site unspecified  Viral URI     Rx / DC Orders   ED Discharge Orders          Ordered    cephALEXin (KEFLEX) 500 MG capsule  2 times daily        11/28/23 1122             Note:  This document was prepared using Dragon voice recognition software and may include unintentional dictation errors.   Cameron Ali, PA-C 11/28/23 1128     Phineas Semen, MD 11/28/23 1258

## 2023-11-28 NOTE — ED Notes (Signed)
POC Urine Preg= Negative  

## 2023-11-28 NOTE — Discharge Instructions (Signed)
 Please take the antibiotics as prescribed. You can take 650 mg of Tylenol and 600 mg of ibuprofen every 6 hours as needed for fever and pain. I also encourage you to take OTC cold medicine as needed. You should begin to feel better in a couple of days. If you are not improving please go to student health or return to the ED.

## 2023-11-28 NOTE — ED Triage Notes (Signed)
 Pt states that she started having s/s last pm, pt reports sore throat, hurts to swallow her saliva, body aches and some cough, pt is tearful in triage

## 2023-12-20 NOTE — Patient Instructions (Signed)
-  Rest and stay well hydrated (by drinking water and other liquids). Avoid/limit caffeine. -Take over-the-counter medicines (i.e. Sudafed, Ibuprofen) to help relieve your symptoms. -Continue saline nasal spray. Try Afrin nasal spray before your flight.  -Use artificial tears as needed for eye redness or irritation. -You may fill the prescription for antibiotic eye drops. Do not use them unless your symptoms get worse as we talked about. -Apply cool compress (cold washcloth) over eyes for 10-20 minutes as needed for pain/swelling/discomfort. -Avoid touching your eyes. When you must touch them, wash your hands or use hand sanitizer afterwards. -Do not wear eye makeup until your symptoms fully resolve. Consider discarding mascara or eye makeup worn recently.  -Send MyChart message to provider or schedule return visit as needed for new/worsening symptoms or if symptoms do not improve as discussed with recommended/prescribed treatment.
